# Patient Record
Sex: Male | Born: 2000
Health system: Southern US, Community
[De-identification: ages and names within clinical notes are randomized; demographics above are authoritative.]

## PROBLEM LIST (undated history)

## (undated) DIAGNOSIS — F909 Attention-deficit hyperactivity disorder, unspecified type: Secondary | ICD-10-CM

## (undated) DIAGNOSIS — R51 Headache: Secondary | ICD-10-CM

## (undated) DIAGNOSIS — F419 Anxiety disorder, unspecified: Secondary | ICD-10-CM

## (undated) HISTORY — DX: Attention-deficit hyperactivity disorder, unspecified type: F90.9

## (undated) HISTORY — DX: Headache: R51

## (undated) HISTORY — DX: Anxiety disorder, unspecified: F41.9

## (undated) HISTORY — PX: OTHER SURGICAL HISTORY: SHX169

---
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
## 2022-01-19 ENCOUNTER — Ambulatory Visit: Payer: 59 | Admitting: Behavioral Health

## 2022-01-19 ENCOUNTER — Other Ambulatory Visit: Payer: Self-pay

## 2022-01-19 DIAGNOSIS — F3341 Major depressive disorder, recurrent, in partial remission: Secondary | ICD-10-CM

## 2022-01-19 DIAGNOSIS — F902 Attention-deficit hyperactivity disorder, combined type: Secondary | ICD-10-CM

## 2022-01-19 DIAGNOSIS — F4312 Post-traumatic stress disorder, chronic: Secondary | ICD-10-CM

## 2022-01-19 DIAGNOSIS — F951 Chronic motor or vocal tic disorder: Secondary | ICD-10-CM

## 2022-01-19 MED ORDER — METHYLPHENIDATE HCL ER (OSM) 36 MG PO TBCR: 36.0000 mg | EXTENDED_RELEASE_TABLET | Freq: Every day | ORAL | 0 refills | Status: DC

## 2022-01-19 MED ORDER — MIRTAZAPINE 15 MG PO TABS: 15.0000 mg | ORAL_TABLET | Freq: Every day | ORAL | 0 refills | Status: DC

## 2022-02-23 ENCOUNTER — Ambulatory Visit (INDEPENDENT_AMBULATORY_CARE_PROVIDER_SITE_OTHER): Payer: 59 | Admitting: Behavioral Health

## 2022-02-23 ENCOUNTER — Encounter: Payer: Self-pay | Admitting: Behavioral Health

## 2022-02-23 DIAGNOSIS — F902 Attention-deficit hyperactivity disorder, combined type: Secondary | ICD-10-CM

## 2022-02-23 DIAGNOSIS — F3341 Major depressive disorder, recurrent, in partial remission: Secondary | ICD-10-CM | POA: Diagnosis not present

## 2022-02-23 NOTE — Progress Notes (Signed)
[facility]  Patient ID: Dylan Bryant,  MRN: [id number]  PCP: Chales Salmon, MD  Date of Evaluation: 02/23/2022 Time spent:20 minutes  Chief Complaint:  Chief Complaint   Anxiety; Depression; Medication Problem; Follow-up     HISTORY/CURRENT STATUS: HPI  Dylan Bryant presents for follow-up and mediation management.  Says that he has stopped his stimulant medication for the summer. Says that he was unhappy how the medication was making him irritable and increasing his HR. HR would increase to 110 plus. Now that he has stopped HR has returned to the 70's.  He does not want to try new medication now but would like to consider a new med before school starts back in the fall. He says that his anxiety level is 2/10 and depression is 2/10. Says he is sleeping 8-10 hours per night.  No mania, no psychosis, no SI/HI.   No prior medication failures noted.  Individual Medical History/ Review of Systems: Changes? :No   Allergies: Patient has no known allergies.  Current Medications:  Current Outpatient Medications:    methylphenidate (CONCERTA) 36 MG PO CR tablet, Take 1 tablet (36 mg total) by mouth daily., Disp: 30 tablet, Rfl: 0   methylphenidate (CONCERTA) 36 MG PO CR tablet, Take 1 tablet (36 mg total) by mouth daily., Disp: 30 tablet, Rfl: 0   methylphenidate (CONCERTA) 54 MG PO CR tablet, Take 1 tablet (54 mg total) by mouth every morning., Disp: 30 tablet, Rfl: 0   methylphenidate (CONCERTA) 54 MG PO CR tablet, Take 1 tablet (54 mg total) by mouth every morning., Disp: 30 tablet, Rfl: 0   mirtazapine (REMERON) 15 MG tablet, Take 1 tablet (15 mg total) by mouth at bedtime., Disp: 90 tablet, Rfl: 0 Medication Side Effects: none  Family Medical/ Social History: Changes? No  MENTAL HEALTH EXAM:  There were no vitals taken for this visit.There is no height or weight on file to calculate BMI.  General Appearance: Casual, Neat, and Well Groomed  Eye Contact:  Good  Speech:   Clear and Coherent  Volume:  Normal  Mood:  Anxious  Affect:  Appropriate  Thought Process:  Coherent  Orientation:  Full (Time, Place, and Person)  Thought Content: Logical   Suicidal Thoughts:  No  Homicidal Thoughts:  No  Memory:  WNL  Judgement:  Good  Insight:  Good  Psychomotor Activity:  Normal  Concentration:  Concentration: Good  Recall:  Good  Fund of Knowledge: Good  Language: Good  Assets:  Desire for Improvement  ADL's:  Intact  Cognition: WNL  Prognosis:  Good    DIAGNOSES:    ICD-10-CM   1. Attention deficit hyperactivity disorder (ADHD), combined type, severe  F90.2     2. Recurrent major depression in partial remission (HCC)  F33.41       Receiving Psychotherapy: No    RECOMMENDATIONS:  Stopped Concerta 36 mg daily Continue on Mirtazapine 15 mg at bedtime Will report any changes or worsening conditon To follow up in 3 months to reassess Greater than 50% of 30 min of virtual face to face time with patient was spent on counseling and coordination of care. Discussed his stability with ADHD. He has recently stopped taking Concerta over the summer while school is out. He also stopped because his HR was increased so much he did not feel well. Says his HR would increase to 110 BPM but now that he stopped the medication, it has returned to 70's. He would like to consider a  non-stimulant to try before school starts back again. Will follow up at end of the summer.  Discussed potential benefits, risks, and side effects of stimulants with patient to include increased heart rate, palpitations, insomnia, increased anxiety, increased irritability, or decreased appetite.  Instructed patient to contact office if experiencing any significant tolerability issues.   PDMP reviewed   Joan Flores, NP

## 2022-02-28 ENCOUNTER — Telehealth: Payer: Self-pay | Admitting: Behavioral Health

## 2022-02-28 NOTE — Telephone Encounter (Signed)
Pt's mom, Beth LVM at 9:19a.  She said at appt with Arlys John last week, he discussed starting a new med for Less's ADHD.  I think she said "Quelvi" in the voice mail but not sure.  She said it has a side effect of causing a fast heart rate and that was what he had with Concerta, which he has stopped taking.  She is interested in finding out more about what the reason is for the appt in Aug. (It seems like it's just a follow up for his other current med?).  Pls call her back at [phone].  Next appt 8/1

## 2022-03-02 NOTE — Telephone Encounter (Signed)
Pt's mom called back.  She is asking for a return call.  She said they are going out of town soon and she's trying to get this wrapped up.

## 2022-04-07 ENCOUNTER — Telehealth: Payer: Self-pay | Admitting: Behavioral Health

## 2022-04-07 NOTE — Telephone Encounter (Signed)
Mom, Waynetta Sandy,, called because Dylan Bryant has not tolerated the new medication Quelbree.  Makes very agitated.  He stopped taking and wants to go back on the Concerta.  Leaving for school 8/26.  She is thinking that since he is not going to continue the [place], the appt 8/1 is not needed because if he just goes back on the Concerta nothing has really changed.  He would need a new prescription of the Concerta.  Perhaps if he could get #90 since he is returning to school.  [facility] [address] Please call about rather or not he needs to keep the appt 8/1 and/or if the prescription for Concerta will be sent in.

## 2022-04-07 NOTE — Telephone Encounter (Signed)
Please call to cancel 8/1 appt with Arlys John. Arlys John said to schedule 3 months out, but mom said patient would not be home until Christmas break.    Phone:  [phone] Judie Petit)

## 2022-04-07 NOTE — Telephone Encounter (Signed)
Pend the med. Its ok, and no appointment needed now. Have them schedule appt for 3 months out if everything remains ok.

## 2022-04-07 NOTE — Telephone Encounter (Signed)
Mom called. LVM that patient was not able to tolerate Qelbree and wants to restart Concerta. Mom is also questioning if he needs to keep next appt since no change other than going back on Concerta. Please advise. I will pend med if ok.

## 2022-04-10 ENCOUNTER — Other Ambulatory Visit: Payer: Self-pay

## 2022-04-10 DIAGNOSIS — F902 Attention-deficit hyperactivity disorder, combined type: Secondary | ICD-10-CM

## 2022-04-10 MED ORDER — METHYLPHENIDATE HCL ER (OSM) 54 MG PO TBCR
54.0000 mg | EXTENDED_RELEASE_TABLET | ORAL | 0 refills | Status: DC
Start: 2022-04-10 — End: 2022-04-10

## 2022-04-10 MED ORDER — METHYLPHENIDATE HCL ER (OSM) 36 MG PO TBCR
36.0000 mg | EXTENDED_RELEASE_TABLET | Freq: Every day | ORAL | 0 refills | Status: DC
Start: 2022-04-10 — End: 2023-04-23

## 2022-04-10 NOTE — Telephone Encounter (Signed)
I can try but I am not sure they will do this.

## 2022-04-10 NOTE — Telephone Encounter (Signed)
Pended.

## 2022-04-11 ENCOUNTER — Ambulatory Visit: Payer: 59 | Admitting: Behavioral Health

## 2022-05-01 ENCOUNTER — Other Ambulatory Visit: Payer: Self-pay | Admitting: Behavioral Health

## 2022-05-01 DIAGNOSIS — F951 Chronic motor or vocal tic disorder: Secondary | ICD-10-CM

## 2022-05-01 DIAGNOSIS — F4312 Post-traumatic stress disorder, chronic: Secondary | ICD-10-CM

## 2022-05-01 DIAGNOSIS — F3341 Major depressive disorder, recurrent, in partial remission: Secondary | ICD-10-CM

## 2022-05-01 NOTE — Telephone Encounter (Signed)
Patient's mom lvm at 10:20 requesting refill for Mirtazapine 15mg . States that patient is leaving for school on Friday and needs a 90 day prescription sent in. Ph: [phone] [facility] [id number]

## 2022-07-27 ENCOUNTER — Other Ambulatory Visit: Payer: Self-pay | Admitting: Behavioral Health

## 2022-07-27 DIAGNOSIS — F951 Chronic motor or vocal tic disorder: Secondary | ICD-10-CM

## 2022-07-27 DIAGNOSIS — F4312 Post-traumatic stress disorder, chronic: Secondary | ICD-10-CM

## 2022-07-27 DIAGNOSIS — F3341 Major depressive disorder, recurrent, in partial remission: Secondary | ICD-10-CM

## 2022-07-31 ENCOUNTER — Encounter: Payer: Self-pay | Admitting: Behavioral Health

## 2022-07-31 ENCOUNTER — Ambulatory Visit (INDEPENDENT_AMBULATORY_CARE_PROVIDER_SITE_OTHER): Payer: 59 | Admitting: Behavioral Health

## 2022-07-31 DIAGNOSIS — F4312 Post-traumatic stress disorder, chronic: Secondary | ICD-10-CM

## 2022-07-31 DIAGNOSIS — F3341 Major depressive disorder, recurrent, in partial remission: Secondary | ICD-10-CM | POA: Diagnosis not present

## 2022-07-31 DIAGNOSIS — F951 Chronic motor or vocal tic disorder: Secondary | ICD-10-CM | POA: Diagnosis not present

## 2022-07-31 MED ORDER — MIRTAZAPINE 15 MG PO TABS: ORAL_TABLET | ORAL | 1 refills | Status: DC

## 2022-07-31 NOTE — Progress Notes (Signed)
[facility]  Patient ID: Dylan Bryant,  MRN: [id number]  PCP: Chales Salmon, MD  Date of Evaluation: 07/31/2022 Time spent:30 minutes  Chief Complaint:  Chief Complaint   Depression; Anxiety; Follow-up; Medication Refill; Patient Education; ADHD     HISTORY/CURRENT STATUS: HPI  Dylan Bryant presents for follow-up and mediation management.  Says is is doing "pretty good". He is currently in [employer] in [place]. His grades are well. Says he is not taking his Concerta on a daily basis. He would like to continue with his current medications. Request no changes. He says that his anxiety level is 2/10 and depression is 2/10. Says he is sleeping 8-10 hours per night.  No mania, no psychosis, no SI/HI.   No prior medication failures noted. Individual Medical History/ Review of Systems: Changes? :No   Allergies: Patient has no known allergies.  Current Medications:  Current Outpatient Medications:    methylphenidate (CONCERTA) 36 MG PO CR tablet, Take 1 tablet (36 mg total) by mouth daily., Disp: 30 tablet, Rfl: 0   methylphenidate (CONCERTA) 36 MG PO CR tablet, Take 1 tablet (36 mg total) by mouth daily., Disp: 90 tablet, Rfl: 0   mirtazapine (REMERON) 15 MG tablet, TAKE 1 TABLET BY MOUTH EVERYDAY AT BEDTIME, Disp: 90 tablet, Rfl: 1 Medication Side Effects: none  Family Medical/ Social History: Changes? No  MENTAL HEALTH EXAM:  There were no vitals taken for this visit.There is no height or weight on file to calculate BMI.  General Appearance: Casual, Neat, and Well Groomed  Eye Contact:  Good  Speech:  Clear and Coherent  Volume:  Normal  Mood:  NA  Affect:  Appropriate  Thought Process:  Coherent  Orientation:  Full (Time, Place, and Person)  Thought Content: Logical   Suicidal Thoughts:  No  Homicidal Thoughts:  No  Memory:  WNL  Judgement:  Good  Insight:  Good  Psychomotor Activity:  Normal  Concentration:  Concentration: Good  Recall:  Good  Fund  of Knowledge: Good  Language: Good  Assets:  Desire for Improvement  ADL's:  Intact  Cognition: WNL  Prognosis:  Good    DIAGNOSES:    ICD-10-CM   1. Chronic motor tic disorder  F95.1 mirtazapine (REMERON) 15 MG tablet    2. Recurrent major depression in partial remission (HCC)  F33.41 mirtazapine (REMERON) 15 MG tablet    3. Chronic post-traumatic stress disorder  F43.12 mirtazapine (REMERON) 15 MG tablet      Receiving Psychotherapy: No    RECOMMENDATIONS:   Greater than 50% of 30 min of virtual face to face time with patient was spent on counseling and coordination of care. Discussed his stability with ADHD. He is sleeping well with aid of Mirtazapine. He does not want to adjust any of his medications today.  Discussed potential benefits, risks, and side effects of stimulants with patient to include increased heart rate, palpitations, insomnia, increased anxiety, increased irritability, or decreased appetite.  Instructed patient to contact office if experiencing any significant tolerability issues.   Not taking Concerta 36 mg on a daily basis but does on testing days or with assignments Continue on Mirtazapine 15 mg at bedtime Will report any changes or worsening condition Provided emergency contact information To follow up in 3 months to reassess  PDMP reviewed    Joan Flores, NP

## 2022-10-05 DIAGNOSIS — M5116 Intervertebral disc disorders with radiculopathy, lumbar region: Secondary | ICD-10-CM | POA: Diagnosis not present

## 2022-10-05 DIAGNOSIS — M5416 Radiculopathy, lumbar region: Secondary | ICD-10-CM | POA: Diagnosis not present

## 2022-10-12 DIAGNOSIS — M5416 Radiculopathy, lumbar region: Secondary | ICD-10-CM | POA: Diagnosis not present

## 2022-10-12 DIAGNOSIS — M5116 Intervertebral disc disorders with radiculopathy, lumbar region: Secondary | ICD-10-CM | POA: Diagnosis not present

## 2022-10-24 ENCOUNTER — Other Ambulatory Visit: Payer: Self-pay

## 2022-10-24 ENCOUNTER — Telehealth: Payer: Self-pay | Admitting: Behavioral Health

## 2022-10-24 DIAGNOSIS — F902 Attention-deficit hyperactivity disorder, combined type: Secondary | ICD-10-CM

## 2022-10-24 MED ORDER — METHYLPHENIDATE HCL ER (OSM) 36 MG PO TBCR: 36.0000 mg | EXTENDED_RELEASE_TABLET | Freq: Every day | ORAL | 0 refills | Status: DC

## 2022-10-24 NOTE — Telephone Encounter (Signed)
65, [place] mother, called in to request RF of Concerta for patient who is at college. [facility] [place]. Appt 2/19

## 2022-10-24 NOTE — Telephone Encounter (Signed)
Pended.

## 2022-10-26 DIAGNOSIS — M5116 Intervertebral disc disorders with radiculopathy, lumbar region: Secondary | ICD-10-CM | POA: Diagnosis not present

## 2022-10-26 DIAGNOSIS — M5416 Radiculopathy, lumbar region: Secondary | ICD-10-CM | POA: Diagnosis not present

## 2022-10-30 ENCOUNTER — Ambulatory Visit: Payer: 59 | Admitting: Behavioral Health

## 2022-12-07 ENCOUNTER — Encounter: Payer: Self-pay | Admitting: Behavioral Health

## 2022-12-07 ENCOUNTER — Ambulatory Visit (INDEPENDENT_AMBULATORY_CARE_PROVIDER_SITE_OTHER): Payer: 59 | Admitting: Behavioral Health

## 2022-12-07 DIAGNOSIS — F902 Attention-deficit hyperactivity disorder, combined type: Secondary | ICD-10-CM

## 2022-12-07 DIAGNOSIS — F3341 Major depressive disorder, recurrent, in partial remission: Secondary | ICD-10-CM | POA: Diagnosis not present

## 2022-12-07 DIAGNOSIS — F4312 Post-traumatic stress disorder, chronic: Secondary | ICD-10-CM | POA: Diagnosis not present

## 2022-12-07 DIAGNOSIS — F951 Chronic motor or vocal tic disorder: Secondary | ICD-10-CM | POA: Diagnosis not present

## 2022-12-07 MED ORDER — MIRTAZAPINE 15 MG PO TABS: ORAL_TABLET | ORAL | 1 refills | Status: DC

## 2022-12-07 MED ORDER — METHYLPHENIDATE HCL ER (OSM) 36 MG PO TBCR: 36.0000 mg | EXTENDED_RELEASE_TABLET | Freq: Every day | ORAL | 0 refills | Status: DC

## 2022-12-07 NOTE — Progress Notes (Signed)
[facility]  Patient ID: Dylan Bryant,  MRN: [id number]  PCP: Harrie Jeans, MD  Date of Evaluation: 12/07/2022 Time spent:30 minutes  Chief Complaint:  Chief Complaint   ADHD; Follow-up; Medication Refill; Patient Education     HISTORY/CURRENT STATUS: HPI  Owenn Bryant presents for follow-up and mediation management.  No changes since last visit. Says is is doing "pretty good". He is currently in [employer] in [place]. His grades are well. Says he is not taking his Concerta on a daily basis. He would like to continue with his current medications. Request no changes. He says that his anxiety level is 2/10 and depression is 2/10. Says he is sleeping 8-10 hours per night.  No mania, no psychosis, no SI/HI.   No prior medication failures noted.       Individual Medical History/ Review of Systems: Changes? :No   Allergies: Patient has no known allergies.  Current Medications:  Current Outpatient Medications:    methylphenidate (CONCERTA) 36 MG PO CR tablet, Take 1 tablet (36 mg total) by mouth daily., Disp: 90 tablet, Rfl: 0   methylphenidate (CONCERTA) 36 MG PO CR tablet, Take 1 tablet (36 mg total) by mouth daily., Disp: 90 tablet, Rfl: 0   mirtazapine (REMERON) 15 MG tablet, TAKE 1 TABLET BY MOUTH EVERYDAY AT BEDTIME, Disp: 90 tablet, Rfl: 1 Medication Side Effects: none  Family Medical/ Social History: Changes? No  MENTAL HEALTH EXAM:  There were no vitals taken for this visit.There is no height or weight on file to calculate BMI.  General Appearance: Casual, Neat, and Well Groomed  Eye Contact:  Good  Speech:  Clear and Coherent  Volume:  Normal  Mood:  NA  Affect:  Appropriate  Thought Process:  Coherent  Orientation:  Full (Time, Place, and Person)  Thought Content: Logical   Suicidal Thoughts:  No  Homicidal Thoughts:  No  Memory:  WNL  Judgement:  Good  Insight:  Good  Psychomotor Activity:  Normal  Concentration:  Concentration: Poor   Recall:  Poor  Fund of Knowledge: Fair  Language: Fair  Assets:  Desire for Improvement  ADL's:  Intact  Cognition: WNL  Prognosis:  Good    DIAGNOSES:    ICD-10-CM   1. Attention deficit hyperactivity disorder (ADHD), combined type, severe  F90.2 methylphenidate (CONCERTA) 36 MG PO CR tablet    2. Chronic motor tic disorder  F95.1 mirtazapine (REMERON) 15 MG tablet    3. Recurrent major depression in partial remission (HCC)  F33.41 mirtazapine (REMERON) 15 MG tablet    4. Chronic post-traumatic stress disorder  F43.12 mirtazapine (REMERON) 15 MG tablet      Receiving Psychotherapy: No    RECOMMENDATIONS:    Greater than 50% of 30 min of virtual face to face time with patient was spent on counseling and coordination of care. Discussed his stability with ADHD. He is sleeping well with aid of Mirtazapine. He stopped his Concerta for about one week and was having some mood swings. I educated him that he should no stop medication cold [place] and that he could be experiencing withdrawals. He agreed to take medication daily. He does not want to adjust any of his medications today.   Discussed potential benefits, risks, and side effects of stimulants with patient to include increased heart rate, palpitations, insomnia, increased anxiety, increased irritability, or decreased appetite.  Instructed patient to contact office if experiencing any significant tolerability issues.    Not taking Concerta 36 mg on a  daily basis but does on testing days or with assignments Continue on Mirtazapine 15 mg at bedtime Will report any changes or worsening condition Provided emergency contact information To follow up in 3 months to reassess  PDMP reviewed        Dylan Brooklyn, NP

## 2023-02-08 ENCOUNTER — Telehealth: Payer: Self-pay | Admitting: Behavioral Health

## 2023-02-08 NOTE — Telephone Encounter (Signed)
Mom, Dylan Bryant, called to CA Brandi's appt 6/28.  He is in [place] and would have to make special arrangements to get back for the appt. He was seen in March so she was wondering why he had to come back so soon since no changes have been made.  Let her know if it is ok to push the appt out and then she can RS for a time when [name] is home.  There is a DPR on file to talk with [facility].  Please call her to let her know when to reschedule. [phone]

## 2023-02-09 NOTE — Telephone Encounter (Signed)
Told mom he needed to be seen at least every 6 mo. She will schedule an appt when he returns for the summer.

## 2023-03-09 ENCOUNTER — Ambulatory Visit: Payer: 59 | Admitting: Behavioral Health

## 2023-03-22 ENCOUNTER — Telehealth: Payer: Self-pay | Admitting: Behavioral Health

## 2023-03-22 NOTE — Telephone Encounter (Signed)
LF 90-day supply on 5/15.

## 2023-03-22 NOTE — Telephone Encounter (Signed)
Next visit is 04/23/23. Mom requesting refill on Concerta 35 mg [facility] [address], [place], [place].   [phone]

## 2023-03-22 NOTE — Telephone Encounter (Signed)
[facility] [address] Called mom to let her know and she said he was out of medication and that the pharmacy gave her a different date. I told her that date is the day it is run thru insurance and the actual pick up date could be different, but it would not be earlier than that date.

## 2023-04-23 ENCOUNTER — Ambulatory Visit: Payer: 59 | Admitting: Behavioral Health

## 2023-04-23 ENCOUNTER — Encounter: Payer: Self-pay | Admitting: Behavioral Health

## 2023-04-23 DIAGNOSIS — F902 Attention-deficit hyperactivity disorder, combined type: Secondary | ICD-10-CM | POA: Diagnosis not present

## 2023-04-23 MED ORDER — METHYLPHENIDATE HCL ER (OSM) 36 MG PO TBCR: 36.0000 mg | EXTENDED_RELEASE_TABLET | Freq: Every day | ORAL | 0 refills | Status: DC

## 2023-04-23 NOTE — Progress Notes (Addendum)
[facility]  Patient ID: Dylan Bryant,  MRN: [id number]  PCP: Chales Salmon, MD  Date of Evaluation: 04/23/2023 Time spent:30 minutes  Chief Complaint:  Chief Complaint   Follow-up; Medication Refill; Patient Education; ADHD     HISTORY/CURRENT STATUS: HPI   Dylan Bryant presents for follow-up and mediation management.  No changes since last visit. Says is is doing "pretty good". He will be returning to [employer] this month.  Says he is not taking his Concerta on a daily basis. He would like to continue with his current medications. Request no changes. He says that his anxiety level is 2/10 and depression is 2/10. Says he is sleeping 8-10 hours per night.  No mania, no psychosis, no SI/HI.   No prior medication failures noted.       Individual Medical History/ Review of Systems: Changes? :No   Allergies: Patient has no known allergies.  Current Medications:  Current Outpatient Medications:    methylphenidate (CONCERTA) 36 MG PO CR tablet, Take 1 tablet (36 mg total) by mouth daily., Disp: 90 tablet, Rfl: 0   methylphenidate (CONCERTA) 36 MG PO CR tablet, Take 1 tablet (36 mg total) by mouth daily., Disp: 90 tablet, Rfl: 0   mirtazapine (REMERON) 15 MG tablet, TAKE 1 TABLET BY MOUTH EVERYDAY AT BEDTIME, Disp: 90 tablet, Rfl: 1 Medication Side Effects: none  Family Medical/ Social History: Changes? No  MENTAL HEALTH EXAM:  There were no vitals taken for this visit.There is no height or weight on file to calculate BMI.  General Appearance: Casual  Eye Contact:  Good  Speech:  Clear and Coherent  Volume:  Normal  Mood:  Anxious and Depressed  Affect:  Appropriate  Thought Process:  Coherent  Orientation:  Full (Time, Place, and Person)  Thought Content: Logical   Suicidal Thoughts:  No  Homicidal Thoughts:  No  Memory:  WNL  Judgement:  Good  Insight:  Good  Psychomotor Activity:  Normal  Concentration:  Concentration: Good  Recall:   Good  Fund of Knowledge: Good  Language: Good  Assets:  Desire for Improvement  ADL's:  Intact  Cognition: WNL  Prognosis:  Good    DIAGNOSES:    ICD-10-CM   1. Attention deficit hyperactivity disorder (ADHD), combined type, severe  F90.2 methylphenidate (CONCERTA) 36 MG PO CR tablet      Receiving Psychotherapy: No    RECOMMENDATIONS:   Greater than 50% of 30 min of virtual face to face time with patient was spent on counseling and coordination of care. Discussed his stability with ADHD. He is sleeping well with aid of Mirtazapine. He stopped his Concerta for about one week and was having some mood swings. I educated him that he should no stop medication cold [place] and that he could be experiencing withdrawals. He agreed to take medication daily. He does not want to adjust any of his medications today.   Discussed potential benefits, risks, and side effects of stimulants with patient to include increased heart rate, palpitations, insomnia, increased anxiety, increased irritability, or decreased appetite.  Instructed patient to contact office if experiencing any significant tolerability issues.    Not taking Concerta 36 mg on a daily basis but does on testing days or with assignments Continue on Mirtazapine 15 mg at bedtime Will report any changes or worsening condition Provided emergency contact information To follow up in 3 months to reassess  PDMP reviewed     Joan Flores, NP

## 2023-07-12 ENCOUNTER — Other Ambulatory Visit: Payer: Self-pay | Admitting: Behavioral Health

## 2023-07-12 DIAGNOSIS — F4312 Post-traumatic stress disorder, chronic: Secondary | ICD-10-CM

## 2023-07-12 DIAGNOSIS — F951 Chronic motor or vocal tic disorder: Secondary | ICD-10-CM

## 2023-07-12 DIAGNOSIS — F3341 Major depressive disorder, recurrent, in partial remission: Secondary | ICD-10-CM

## 2023-07-24 ENCOUNTER — Telehealth: Payer: Self-pay | Admitting: Behavioral Health

## 2023-07-24 ENCOUNTER — Other Ambulatory Visit: Payer: Self-pay

## 2023-07-24 DIAGNOSIS — F902 Attention-deficit hyperactivity disorder, combined type: Secondary | ICD-10-CM

## 2023-07-24 MED ORDER — METHYLPHENIDATE HCL ER (OSM) 36 MG PO TBCR: 36.0000 mg | EXTENDED_RELEASE_TABLET | Freq: Every day | ORAL | 0 refills | Status: DC

## 2023-07-24 NOTE — Telephone Encounter (Signed)
PENDED

## 2023-07-24 NOTE — Telephone Encounter (Signed)
Next appt is 08/24/23. Requesting refill for Concerta 90 day RX sent to:  [id number] [place], [place] - [address]   Phone: [phone]  Fax: [phone]

## 2023-08-21 ENCOUNTER — Ambulatory Visit: Payer: 59 | Admitting: Behavioral Health

## 2023-08-24 ENCOUNTER — Ambulatory Visit: Payer: 59 | Admitting: Behavioral Health

## 2023-08-27 ENCOUNTER — Ambulatory Visit: Payer: 59 | Admitting: Behavioral Health

## 2023-08-28 DIAGNOSIS — F909 Attention-deficit hyperactivity disorder, unspecified type: Secondary | ICD-10-CM | POA: Diagnosis not present

## 2023-08-28 DIAGNOSIS — R17 Unspecified jaundice: Secondary | ICD-10-CM | POA: Diagnosis not present

## 2023-08-30 DIAGNOSIS — D582 Other hemoglobinopathies: Secondary | ICD-10-CM | POA: Diagnosis not present

## 2023-08-30 DIAGNOSIS — R000 Tachycardia, unspecified: Secondary | ICD-10-CM | POA: Diagnosis not present

## 2023-08-30 DIAGNOSIS — R17 Unspecified jaundice: Secondary | ICD-10-CM | POA: Diagnosis not present

## 2023-08-30 DIAGNOSIS — Z0000 Encounter for general adult medical examination without abnormal findings: Secondary | ICD-10-CM | POA: Diagnosis not present

## 2023-08-30 DIAGNOSIS — F909 Attention-deficit hyperactivity disorder, unspecified type: Secondary | ICD-10-CM | POA: Diagnosis not present

## 2023-09-07 ENCOUNTER — Ambulatory Visit: Payer: 59 | Admitting: Behavioral Health

## 2023-09-11 ENCOUNTER — Encounter: Payer: Self-pay | Admitting: Behavioral Health

## 2023-09-11 ENCOUNTER — Ambulatory Visit (INDEPENDENT_AMBULATORY_CARE_PROVIDER_SITE_OTHER): Payer: 59 | Admitting: Behavioral Health

## 2023-09-11 DIAGNOSIS — F902 Attention-deficit hyperactivity disorder, combined type: Secondary | ICD-10-CM | POA: Diagnosis not present

## 2023-09-11 DIAGNOSIS — F4312 Post-traumatic stress disorder, chronic: Secondary | ICD-10-CM

## 2023-09-11 DIAGNOSIS — F951 Chronic motor or vocal tic disorder: Secondary | ICD-10-CM

## 2023-09-11 DIAGNOSIS — F3341 Major depressive disorder, recurrent, in partial remission: Secondary | ICD-10-CM

## 2023-09-11 MED ORDER — MIRTAZAPINE 15 MG PO TABS: ORAL_TABLET | ORAL | 1 refills | Status: DC

## 2023-09-11 MED ORDER — METHYLPHENIDATE HCL ER (OSM) 54 MG PO TBCR: 54.0000 mg | EXTENDED_RELEASE_TABLET | ORAL | 0 refills | Status: DC

## 2023-09-11 NOTE — Progress Notes (Signed)
 [facility]  Patient ID: Dylan Bryant,  MRN: [id number]  PCP: Dylan Hoose, MD  Date of Evaluation: 09/11/2023 Time spent:30 minutes  Chief Complaint:   HISTORY/CURRENT STATUS: HPI Dylan Bryant presents for follow-up and mediation management. Doing well overall but is struggling in school this semester. He is experiencing some crash in the late afternoon when he needs to study. Asking for increase of dosage with his Concerta . He will be returning to [employer] this month.  Request no changes. He says that his anxiety level is 2/10 and depression is 2/10. Says he is sleeping 6-7 hours per night.  No mania, no psychosis, no SI/HI.   No prior medication failures noted.   Individual Medical History/ Review of Systems: Changes? :No   Allergies: Patient has no known allergies.  Current Medications:  Current Outpatient Medications:    methylphenidate  (CONCERTA ) 54 MG PO CR tablet, Take 1 tablet (54 mg total) by mouth every morning., Disp: 30 tablet, Rfl: 0   methylphenidate  (CONCERTA ) 36 MG PO CR tablet, Take 1 tablet (36 mg total) by mouth daily., Disp: 90 tablet, Rfl: 0   methylphenidate  (CONCERTA ) 36 MG PO CR tablet, Take 1 tablet (36 mg total) by mouth daily., Disp: 90 tablet, Rfl: 0   mirtazapine  (REMERON ) 15 MG tablet, TAKE 1 TABLET BY MOUTH EVERYDAY AT BEDTIME, Disp: 90 tablet, Rfl: 1 Medication Side Effects: none  Family Medical/ Social History: Changes? No  MENTAL HEALTH EXAM:  There were no vitals taken for this visit.There is no height or weight on file to calculate BMI.  General Appearance: Casual and Neat  Eye Contact:  Good  Speech:  Clear and Coherent  Volume:  Normal  Mood:  NA  Affect:  Appropriate  Thought Process:  Coherent  Orientation:  Full (Time, Place, and Person)  Thought Content: Logical   Suicidal Thoughts:  No  Homicidal Thoughts:  No  Memory:  WNL  Judgement:  Good  Insight:  Good  Psychomotor Activity:  Normal   Concentration:  Concentration: Good  Recall:  Good  Fund of Knowledge: Good  Language: Good  Assets:  Desire for Improvement  ADL's:  Intact  Cognition: WNL  Prognosis:  Good    DIAGNOSES:    ICD-10-CM   1. Attention deficit hyperactivity disorder (ADHD), combined type, severe  F90.2 methylphenidate  (CONCERTA ) 54 MG PO CR tablet    2. Chronic motor tic disorder  F95.1 mirtazapine  (REMERON ) 15 MG tablet    3. Recurrent major depression in partial remission (HCC)  F33.41 mirtazapine  (REMERON ) 15 MG tablet    4. Chronic post-traumatic stress disorder  F43.12 mirtazapine  (REMERON ) 15 MG tablet      Receiving Psychotherapy: No    RECOMMENDATIONS:   Greater than 50% of 30 min of virtual face to face time with patient was spent on counseling and coordination of care. Discussed his stability with ADHD. He is sleeping well with aid of Mirtazapine . We discussed his request to increase Concerta  and discussed pro and cons as well as it possibly effecting sleep.  He agreed to take medication daily and take 1-2 days break from the drug per week. Discussed potential benefits, risks, and side effects of stimulants with patient to include increased heart rate, palpitations, insomnia, increased anxiety, increased irritability, or decreased appetite.  Instructed patient to contact office if experiencing any significant tolerability issues.    Increase dose to 54 mg on a daily basis but does on testing days or with assignments Continue on Mirtazapine   15 mg at bedtime Will report any changes or worsening condition Provided emergency contact information To follow up in 3 months to reassess  PDMP reviewed      Dylan DELENA Pizza, NP

## 2023-09-18 ENCOUNTER — Telehealth: Payer: Self-pay | Admitting: Psychiatry

## 2023-09-18 NOTE — Telephone Encounter (Signed)
 Mom, Dylan Bryant, called and LM at 3:42 indicated that Dylan Bryant had expressed concerns about the medication no lasting long enough.  A prescription for Concerta  54 mg was sent in but it doesn't seem to last any longer.  It was suggested you could add a 10mg  in the pm.  He would like to try this.  He is getting ready to go back to schools so needs a 90 day supply of both Concerta 's.  Appt 3/28

## 2023-09-19 ENCOUNTER — Other Ambulatory Visit: Payer: Self-pay

## 2023-09-19 MED ORDER — METHYLPHENIDATE HCL 10 MG PO TABS: 10.0000 mg | ORAL_TABLET | Freq: Every day | ORAL | 0 refills | Status: AC

## 2023-09-19 NOTE — Telephone Encounter (Signed)
 Mom returned call. I told her the 90-day supply for 10 mg was sent this morning, but he just filled the 54 mg dose last week.  Pt is in school in [facility]. I told her we could send Rx to pharmacy there when needed.

## 2023-09-19 NOTE — Telephone Encounter (Signed)
 At 12/31 visit Concerta was increased to 54 mg. Mom said it still isn't enough and is asking to add an afternoon dose of 10 mg. Will pend as appropriate.

## 2023-09-19 NOTE — Telephone Encounter (Signed)
 Pended a 90-day supply of 10 mg, but patient just filled 30-day supply of 54 mg 12/31.  LVM to RC.

## 2023-10-19 ENCOUNTER — Telehealth: Payer: Self-pay | Admitting: Behavioral Health

## 2023-10-19 NOTE — Telephone Encounter (Signed)
 Mom called and wants to know if we can push Fue's appointment to may when he will be home for the summer. This would be easier since he is at college in [place] . . Please call mom  beth at [phone]

## 2023-10-22 NOTE — Telephone Encounter (Signed)
 Please reschedule appt to May.

## 2023-10-22 NOTE — Telephone Encounter (Signed)
 Lvm for pt to call and schedule in may

## 2023-11-27 ENCOUNTER — Other Ambulatory Visit: Payer: Self-pay

## 2023-11-27 ENCOUNTER — Telehealth: Payer: Self-pay | Admitting: Behavioral Health

## 2023-11-27 DIAGNOSIS — F902 Attention-deficit hyperactivity disorder, combined type: Secondary | ICD-10-CM

## 2023-11-27 MED ORDER — METHYLPHENIDATE HCL ER (OSM) 54 MG PO TBCR: 54.0000 mg | EXTENDED_RELEASE_TABLET | ORAL | 0 refills | Status: DC

## 2023-11-27 NOTE — Telephone Encounter (Signed)
 Mom lvm that Roderick needs a refill  on his concerta 54 mg. [facility] [address] Next appt in may

## 2023-11-27 NOTE — Telephone Encounter (Signed)
 [facility] [address]

## 2023-12-07 ENCOUNTER — Ambulatory Visit: Payer: 59 | Admitting: Behavioral Health

## 2024-01-17 ENCOUNTER — Encounter: Payer: Self-pay | Admitting: Behavioral Health

## 2024-01-17 ENCOUNTER — Ambulatory Visit: Admitting: Behavioral Health

## 2024-01-17 DIAGNOSIS — F902 Attention-deficit hyperactivity disorder, combined type: Secondary | ICD-10-CM

## 2024-01-17 DIAGNOSIS — F3341 Major depressive disorder, recurrent, in partial remission: Secondary | ICD-10-CM | POA: Diagnosis not present

## 2024-01-17 DIAGNOSIS — F4312 Post-traumatic stress disorder, chronic: Secondary | ICD-10-CM

## 2024-01-17 DIAGNOSIS — F951 Chronic motor or vocal tic disorder: Secondary | ICD-10-CM | POA: Diagnosis not present

## 2024-01-17 MED ORDER — MIRTAZAPINE 15 MG PO TABS: ORAL_TABLET | ORAL | 1 refills | Status: DC

## 2024-01-17 NOTE — Progress Notes (Signed)
 [facility]  Patient ID: Dylan Bryant,  MRN: [id number]  PCP: Lyman Sander, MD  Date of Evaluation: 01/17/2024 Time spent:30 minutes  Chief Complaint:  Chief Complaint   Follow-up; ADHD; Depression; Medication Refill; Anxiety; Patient Education     HISTORY/CURRENT STATUS: HPI Dylan Bryant presents for follow-up and mediation management. Appears very slow and sleepy this am. Says that he stopped taking the medication recently and feels that his focus is the same on or off the medication. Recently took exam without Concerta  and says he did fine. He would like to take prn as necessary. He does not need refill at this time.    He says that his anxiety level is 2/10 and depression is 2/10. Says he is sleeping 6-7 hours per night.  No mania, no psychosis, no SI/HI.   No prior medication failures noted.    Individual Medical History/ Review of Systems: Changes? :No   Allergies: Patient has no known allergies.  Current Medications:  Current Outpatient Medications:    methylphenidate  (CONCERTA ) 54 MG PO CR tablet, Take 1 tablet (54 mg total) by mouth every morning., Disp: 30 tablet, Rfl: 0   methylphenidate  (RITALIN ) 10 MG tablet, Take 1 tablet (10 mg total) by mouth daily at 12 noon., Disp: 90 tablet, Rfl: 0   mirtazapine  (REMERON ) 15 MG tablet, TAKE 1 TABLET BY MOUTH EVERYDAY AT BEDTIME, Disp: 90 tablet, Rfl: 1 Medication Side Effects: none  Family Medical/ Social History: Changes? No  MENTAL HEALTH EXAM:  There were no vitals taken for this visit.There is no height or weight on file to calculate BMI.  General Appearance: Casual, Neat, and Well Groomed  Eye Contact:  Good  Speech:  Clear and Coherent  Volume:  Normal  Mood:  NA  Affect:  Appropriate  Thought Process:  Coherent  Orientation:  Full (Time, Place, and Person)  Thought Content: Logical   Suicidal Thoughts:  No  Homicidal Thoughts:  No  Memory:  WNL  Judgement:  Good  Insight:  Good  Psychomotor  Activity:  Normal  Concentration:  Concentration: Good  Recall:  Good  Fund of Knowledge: Good  Language: Good  Assets:  Desire for Improvement  ADL's:  Intact  Cognition: WNL  Prognosis:  Good    DIAGNOSES:    ICD-10-CM   1. Attention deficit hyperactivity disorder (ADHD), combined type, severe  F90.2     2. Recurrent major depression in partial remission (HCC)  F33.41     3. Chronic motor tic disorder  F95.1     4. Chronic post-traumatic stress disorder  F43.12       Receiving Psychotherapy: No    RECOMMENDATIONS:   Greater than 50% of 30 min of virtual face to face time with patient was spent on counseling and coordination of care. Discussed his stability with ADHD. He is sleeping well with aid of Mirtazapine  but has been taking incorrectly. He has been taking drug without allocating 7-8 hours of sleep when taking. Waking up groggy and slow cognitively.  I reinforced importance of good sleep hygiene, going to bed same time every night. He should not take Mirtazapine  if he wakes up 3 or 4 am and take. Says that he did not know this.  We talked about his desire to not take the medication everyday. Says he does not need it. Recently took test without the med and reports he did fine.  He would like to have on hand in case he needs.   Discussed potential benefits,  risks, and side effects of stimulants with patient to include increased heart rate, palpitations, insomnia, increased anxiety, increased irritability, or decreased appetite.  Instructed patient to contact office if experiencing any significant tolerability issues.    Continue dose to 54 mg on a daily basis but does on testing days or with assignments Continue on Mirtazapine  15 mg at bedtime Will report any changes or worsening condition Provided emergency contact information To follow up in 4 months to reassess  PDMP reviewed    Lincoln Renshaw, NP

## 2024-01-18 ENCOUNTER — Ambulatory Visit: Payer: 59 | Admitting: Behavioral Health

## 2024-04-22 DIAGNOSIS — R17 Unspecified jaundice: Secondary | ICD-10-CM | POA: Diagnosis not present

## 2024-04-22 DIAGNOSIS — H5213 Myopia, bilateral: Secondary | ICD-10-CM | POA: Diagnosis not present

## 2024-04-22 DIAGNOSIS — D582 Other hemoglobinopathies: Secondary | ICD-10-CM | POA: Diagnosis not present

## 2024-04-22 DIAGNOSIS — H43811 Vitreous degeneration, right eye: Secondary | ICD-10-CM | POA: Diagnosis not present

## 2024-05-19 ENCOUNTER — Ambulatory Visit: Admitting: Behavioral Health

## 2024-05-30 DIAGNOSIS — L0100 Impetigo, unspecified: Secondary | ICD-10-CM | POA: Diagnosis not present

## 2024-06-12 ENCOUNTER — Encounter: Payer: Self-pay | Admitting: Behavioral Health

## 2024-06-12 ENCOUNTER — Ambulatory Visit: Admitting: Behavioral Health

## 2024-06-12 DIAGNOSIS — F951 Chronic motor or vocal tic disorder: Secondary | ICD-10-CM | POA: Diagnosis not present

## 2024-06-12 DIAGNOSIS — F902 Attention-deficit hyperactivity disorder, combined type: Secondary | ICD-10-CM

## 2024-06-12 DIAGNOSIS — F3341 Major depressive disorder, recurrent, in partial remission: Secondary | ICD-10-CM | POA: Diagnosis not present

## 2024-06-12 DIAGNOSIS — F4312 Post-traumatic stress disorder, chronic: Secondary | ICD-10-CM | POA: Diagnosis not present

## 2024-06-12 MED ORDER — METHYLPHENIDATE HCL ER (OSM) 54 MG PO TBCR: 54.0000 mg | EXTENDED_RELEASE_TABLET | ORAL | 0 refills | Status: DC

## 2024-06-12 MED ORDER — MIRTAZAPINE 15 MG PO TABS
ORAL_TABLET | ORAL | 1 refills | Status: AC
Start: 2024-06-12 — End: ?

## 2024-06-12 NOTE — Progress Notes (Signed)
 [facility]  Patient ID: Dylan Bryant,  MRN: [id number]  PCP: Sybil Hoose, MD  Date of Evaluation: 06/12/2024 Time spent:20 minutes  Chief Complaint:  Chief Complaint   Follow-up; Patient Education; ADHD; Insomnia; Medication Refill     HISTORY/CURRENT STATUS: HPI Dylan Bryant presents for follow-up and mediation management. Appears very slow and sleepy this am but not as bad as last visit. Says that he would like to continue on Concerta  54 mg.  Continues to use Mirtazapine  nightly.   He says that his anxiety level is 2/10 and depression is 2/10. Says he is sleeping 6-7 hours per night.  No mania, no psychosis, no SI/HI.   No prior medication failures noted.   Individual Medical History/ Review of Systems: Changes? :No   Allergies: Patient has no known allergies.  Current Medications:  Current Outpatient Medications:    methylphenidate  (CONCERTA ) 54 MG PO CR tablet, Take 1 tablet (54 mg total) by mouth every morning., Disp: 30 tablet, Rfl: 0   methylphenidate  (RITALIN ) 10 MG tablet, Take 1 tablet (10 mg total) by mouth daily at 12 noon., Disp: 90 tablet, Rfl: 0   mirtazapine  (REMERON ) 15 MG tablet, TAKE 1 TABLET BY MOUTH EVERYDAY AT BEDTIME, Disp: 90 tablet, Rfl: 1 Medication Side Effects: none  Family Medical/ Social History: Changes? No  MENTAL HEALTH EXAM:  There were no vitals taken for this visit.There is no height or weight on file to calculate BMI.  General Appearance: Casual, Neat, and Well Groomed  Eye Contact:  Fair  Speech:  Clear and Coherent  Volume:  Normal  Mood:  NA  Affect:  Appropriate  Thought Process:  Coherent  Orientation:  Full (Time, Place, and Person)  Thought Content: Logical   Suicidal Thoughts:  No  Homicidal Thoughts:  No  Memory:  WNL  Judgement:  Good  Insight:  Good  Psychomotor Activity:  Normal  Concentration:  Concentration: Good  Recall:  Good  Fund of Knowledge: Good  Language: Good  Assets:  Desire for  Improvement  ADL's:  Intact  Cognition: WNL  Prognosis:  Good    DIAGNOSES:    ICD-10-CM   1. Attention deficit hyperactivity disorder (ADHD), combined type, severe  F90.2 methylphenidate  (CONCERTA ) 54 MG PO CR tablet    2. Recurrent major depression in partial remission  F33.41 mirtazapine  (REMERON ) 15 MG tablet    3. Chronic motor tic disorder  F95.1 mirtazapine  (REMERON ) 15 MG tablet    4. Chronic post-traumatic stress disorder  F43.12 mirtazapine  (REMERON ) 15 MG tablet      Receiving Psychotherapy: No    RECOMMENDATIONS:   Greater than 50% of 30 min of virtual face to face time with patient was spent on counseling and coordination of care. Discussed his stability with ADHD. He is sleeping well with aid of Mirtazapine   I reinforced importance of good sleep hygiene, going to bed same time every night. Discussed his desire to continue on 54 mg dose Concerta  due to increased coverage longer in the day.   Discussed potential benefits, risks, and side effects of stimulants with patient to include increased heart rate, palpitations, insomnia, increased anxiety, increased irritability, or decreased appetite.  Instructed patient to contact office if experiencing any significant tolerability issues.    Continue dose to 54 mg on a daily basis but does on testing days or with assignments Continue on Mirtazapine  15 mg at bedtime Will report any changes or worsening condition Provided emergency contact information To follow up in 4 months  to reassess  PDMP reviewed         Dylan DELENA Pizza, NP

## 2024-06-17 ENCOUNTER — Telehealth: Payer: Self-pay | Admitting: Behavioral Health

## 2024-06-17 ENCOUNTER — Other Ambulatory Visit: Payer: Self-pay

## 2024-06-17 DIAGNOSIS — F902 Attention-deficit hyperactivity disorder, combined type: Secondary | ICD-10-CM

## 2024-06-17 NOTE — Telephone Encounter (Signed)
 Mom lvm requesting a 90 day supply of Concerta  for Dylan Bryant. She stated he only received 30. He is in school so it is difficult to maintain with only a 30 day supply.  Appointment 12/11/24

## 2024-06-17 NOTE — Telephone Encounter (Signed)
Pended 90 day supply.

## 2024-06-18 MED ORDER — METHYLPHENIDATE HCL ER (OSM) 54 MG PO TBCR: 54.0000 mg | EXTENDED_RELEASE_TABLET | ORAL | 0 refills | Status: AC

## 2024-12-11 ENCOUNTER — Ambulatory Visit: Admitting: Behavioral Health
